# Patient Record
Sex: Female | Born: 1970 | Race: White | Hispanic: No | Marital: Single
Health system: Southern US, Community
[De-identification: ages and names within clinical notes are randomized; demographics above are authoritative.]

## PROBLEM LIST (undated history)

## (undated) DIAGNOSIS — C801 Malignant (primary) neoplasm, unspecified: Secondary | ICD-10-CM

---

## 2019-01-24 ENCOUNTER — Other Ambulatory Visit: Payer: Self-pay | Admitting: Obstetrics and Gynecology

## 2019-01-24 DIAGNOSIS — Z1231 Encounter for screening mammogram for malignant neoplasm of breast: Secondary | ICD-10-CM

## 2019-02-14 ENCOUNTER — Inpatient Hospital Stay: Admission: RE | Admit: 2019-02-14 | Payer: Self-pay | Source: Ambulatory Visit

## 2019-03-21 ENCOUNTER — Inpatient Hospital Stay: Admission: RE | Admit: 2019-03-21 | Payer: Self-pay | Source: Ambulatory Visit

## 2019-07-10 ENCOUNTER — Inpatient Hospital Stay: Admission: RE | Admit: 2019-07-10 | Payer: Self-pay | Source: Ambulatory Visit

## 2019-07-23 ENCOUNTER — Ambulatory Visit: Payer: Managed Care, Other (non HMO) | Admitting: Dermatology

## 2019-07-23 ENCOUNTER — Other Ambulatory Visit: Payer: Self-pay

## 2019-07-23 DIAGNOSIS — L821 Other seborrheic keratosis: Secondary | ICD-10-CM

## 2019-07-23 DIAGNOSIS — Z1283 Encounter for screening for malignant neoplasm of skin: Secondary | ICD-10-CM

## 2019-07-23 DIAGNOSIS — L918 Other hypertrophic disorders of the skin: Secondary | ICD-10-CM | POA: Diagnosis not present

## 2019-07-23 DIAGNOSIS — D224 Melanocytic nevi of scalp and neck: Secondary | ICD-10-CM

## 2019-07-23 DIAGNOSIS — L570 Actinic keratosis: Secondary | ICD-10-CM

## 2019-07-23 DIAGNOSIS — D229 Melanocytic nevi, unspecified: Secondary | ICD-10-CM

## 2019-07-23 DIAGNOSIS — D225 Melanocytic nevi of trunk: Secondary | ICD-10-CM | POA: Diagnosis not present

## 2019-07-23 DIAGNOSIS — L578 Other skin changes due to chronic exposure to nonionizing radiation: Secondary | ICD-10-CM

## 2019-07-23 DIAGNOSIS — D489 Neoplasm of uncertain behavior, unspecified: Secondary | ICD-10-CM

## 2019-07-23 DIAGNOSIS — D1801 Hemangioma of skin and subcutaneous tissue: Secondary | ICD-10-CM

## 2019-07-23 DIAGNOSIS — L814 Other melanin hyperpigmentation: Secondary | ICD-10-CM

## 2019-07-23 NOTE — Progress Notes (Addendum)
New Patient Visit  Subjective  Deanna Ryan is a 49 y.o. female who presents for the following: TBSE and skin tags (across chest and right shoulder, wants removed).  No personal skin cancer hx, has extensive family hx of skin cancer, including Grandmother that had a Melonoma  Objective  Well appearing patient in no apparent distress; mood and affect are within normal limits.  A full examination was performed including scalp, head, eyes, ears, nose, lips, neck, chest, axillae, abdomen, back, buttocks, bilateral upper extremities, bilateral lower extremities, hands, feet, fingers, toes, fingernails, and toenails. All findings within normal limits unless otherwise noted below.  Objective  Chest - Medial [facility]), Left chest, Right Breast, Right Hand dorsal, Right post. shoulder: Erythematous thin papules/macules with gritty scale.   Objective  Left Postauricular Area: 0.5 erythematous skin colored papule  Objective  Mid Back left of midline: 0.3 brown papule           Objective  Right Inframammary Fold: Fleshy, skin-colored pedunculated papules.    Assessment & Plan  AK (actinic keratosis) (5) Right Hand dorsal; Chest - Medial [facility]); Left chest; Right Breast; Right post. shoulder   Recommend daily broad spectrum sunscreen SPF 30+ to sun-exposed areas, reapply every 2 hours as needed. Call for new or changing lesions.  Recommend Heliocare daily in sunny weather (can take up to 2 capsules of regular or normal dose of Heliocare ultra for maximum protection during extended hours outdoors)  Liquid nitrogen was applied for 10-12 seconds to the skin lesion and the expected blistering or scabbing reaction explained. Do not pick at the area. Patient reminded to expect hypopigmented scars from the procedure. Return if lesion fails to fully resolve.  Destruction of lesion - Chest - Medial [facility]), Left chest, Right Breast, Right Hand dorsal, Right post.  shoulder  Destruction method: cryotherapy   Informed consent: discussed and consent obtained   Lesion destroyed using liquid nitrogen: Yes   Outcome: patient tolerated procedure well with no complications   Post-procedure details: wound care instructions given    Neoplasm of uncertain behavior (2) Left Postauricular Area - rule out irritated nevus vs other  Epidermal / dermal shaving  Lesion length (cm):  0.5 Lesion width (cm):  0.5 Margin per side (cm):  0.1 Total excision diameter (cm):  0.7 Informed consent: discussed and consent obtained   Timeout: patient name, date of birth, surgical site, and procedure verified   Anesthesia: the lesion was anesthetized in a standard fashion   Anesthetic:  1% lidocaine w/ epinephrine 1-100,000 buffered w/ 8.4% NaHCO3 Instrument used: flexible razor blade   Outcome: patient tolerated procedure well   Post-procedure details: sterile dressing applied and wound care instructions given   Dressing type: petrolatum and pressure dressing    Specimen 2 - Surgical pathology Differential Diagnosis: Irritated nevus vs other Check Margins: No  Mid Back left of midline - rule out atypia  Epidermal / dermal shaving  Lesion length (cm):  0.3 Lesion width (cm):  0.3 Margin per side (cm):  0.2 Total excision diameter (cm):  0.7 Informed consent: discussed and consent obtained   Timeout: patient name, date of birth, surgical site, and procedure verified   Anesthesia: the lesion was anesthetized in a standard fashion   Anesthetic:  1% lidocaine w/ epinephrine 1-100,000 buffered w/ 8.4% NaHCO3 Instrument used: flexible razor blade   Outcome: patient tolerated procedure well   Post-procedure details: sterile dressing applied and wound care instructions given   Dressing type: petrolatum and pressure dressing  Specimen 1 - Surgical pathology Differential Diagnosis: R/O Atypia Check Margins: No  R/o irritated nevus left postauricular R/o atypia mid  back left of midline  Skin tag Right Inframammary Fold  Benign, observe.      Melanocytic Nevi - Tan-brown and/or pink-flesh-colored symmetric macules and papules - Benign appearing on exam today - Observation - Call clinic for new or changing moles - Recommend daily use of broad spectrum spf 30+ sunscreen to sun-exposed areas.   Actinic Damage - diffuse scaly erythematous macules with underlying dyspigmentation - Discussed options of treatment with 5-fluorouracil/calcipotriene or photodynamic therapy to chest including risks and benefits. Patient defers at this time.  - Recommend daily broad spectrum sunscreen SPF 30+ to sun-exposed areas, reapply every 2 hours as needed.  - Call for new or changing lesions.  Lentigines - Scattered tan macules - Discussed due to sun exposure - Benign, observe - Call for any changes  Hemangiomas - Red papules - Discussed benign nature - Observe - Call for any changes  Seborrheic Keratoses - Stuck-on, waxy, tan-brown and white papules and plaques  - Discussed benign etiology and prognosis. - Observe - Call for any changes  Skin cancer screening performed today.   Return for return 1 to 4 months for AK,  TBSE 1 year.   IDonzetta Kohut, CMA, am acting as scribe for Forest Gleason, MD .  Documentation: I have reviewed the above documentation for accuracy and completeness, and I agree with the above.  Forest Gleason, MD

## 2019-07-23 NOTE — Patient Instructions (Addendum)
Recommend daily broad spectrum sunscreen SPF 30+ to sun-exposed areas, reapply every 2 hours as needed. Call for new or changing lesions.  Recommend Heliocare daily in sunny weather (can take up to 2 capsules of regular or normal dose of Heliocare ultra for maximum protection during extended hours outdoors)  Liquid nitrogen was applied for 10-12 seconds to the skin lesion and the expected blistering or scabbing reaction explained. Do not pick at the area. Patient reminded to expect hypopigmented scars from the procedure. Return if lesion fails to fully resolve.    Shave Excision Benign Lesion Wound Care Instructions  . Leave the original bandage on for 24 hours if possible.  If the bandage becomes soaked or soiled before that time, it is OK to remove it and examine the wound.  A small amount of post-operative bleeding is normal.  If excessive bleeding occurs, remove the bandage, place gauze over the site and apply continuous pressure (no peeking) over the area for 20-30 minutes.  If this does not stop the bleeding, try again for 40 minutes.  If this does not work, please call our clinic as soon as possible (even if after-hours).    . Twice a day, cleanse the wound with soap and water.  If a thick crust develops you may use a Q-tip dipped into dilute hydrogen peroxide (mix 1:1 with water) to dissolve it.  Hydrogen peroxide can slow the healing process, so use it only as needed.  After washing, apply Vaseline jelly or Polysporin ointment.  For best healing, the wound should be covered with a layer of ointment at all times.  This may mean re-applying the ointment several times a day.  For open wounds, continue until it has healed.    . If you have any swelling, keep the area elevated.  . Some redness, tenderness and white or yellow material in the wound is normal healing.  If the area becomes very sore and red, or develops a thick yellow-green material (pus), it may be infected; please notify us.     . Wound healing continues for up to one year following surgery.  It is not unusual to experience pain in the scar from time to time during the interval.  If the pain becomes severe or the scar thickens, you should notify the office.  A slight amount of redness in a scar is expected for the first six months.  After six months, the redness subsides and the scar will soften and fade.  The color difference becomes less noticeable with time.  If there are any problems, return for a post-op surgery check at your earliest convenience.  . Please call our office for any questions or concerns.

## 2019-07-25 NOTE — Progress Notes (Signed)
1. 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 On the spectrum from normal mole to melanoma skin cancer, this is in between but it is much closer to a normal mole.  - These typically do not progress to melanoma or cause any trouble.  - People who have a history of atypical moles do have a slightly increased risk of developing melanoma somewhere on the body, so a yearly full body skin exam by a dermatologist is recommended.  - Monthly self skin checks and daily sun protection are also recommended.  - Please call if you notice a dark spot coming back where this biopsy was taken.  - Please also call if you notice any new or changing spots anywhere else on the body before your follow-up visit.  - Please call our office or send [place] a message if you have any questions or concerns about this biopsy result.      2. Skin , left postauricular area MELANOCYTIC NEVUS, INTRADERMAL TYPE, IRRITATED  This is a NORMAL MOLE. No additional treatment is needed. If you notice any new or changing spots or have other skin concerns in future, please call our office at [phone].    MAs please call

## 2019-10-03 ENCOUNTER — Other Ambulatory Visit: Payer: Self-pay | Admitting: Obstetrics and Gynecology

## 2019-10-03 DIAGNOSIS — Z1231 Encounter for screening mammogram for malignant neoplasm of breast: Secondary | ICD-10-CM

## 2019-10-10 ENCOUNTER — Ambulatory Visit
Admission: RE | Admit: 2019-10-10 | Discharge: 2019-10-10 | Disposition: A | Discharge status: Home / Self Care | Payer: Managed Care, Other (non HMO) | Source: Ambulatory Visit | Attending: Obstetrics and Gynecology | Admitting: Obstetrics and Gynecology

## 2019-10-10 ENCOUNTER — Other Ambulatory Visit: Payer: Self-pay

## 2019-10-10 DIAGNOSIS — Z1231 Encounter for screening mammogram for malignant neoplasm of breast: Secondary | ICD-10-CM | POA: Diagnosis present

## 2019-10-10 HISTORY — DX: Malignant (primary) neoplasm, unspecified: C80.1

## 2019-10-16 ENCOUNTER — Other Ambulatory Visit: Payer: Self-pay | Admitting: Obstetrics and Gynecology

## 2019-10-16 DIAGNOSIS — N6310 Unspecified lump in the right breast, unspecified quadrant: Secondary | ICD-10-CM

## 2019-10-16 DIAGNOSIS — R928 Other abnormal and inconclusive findings on diagnostic imaging of breast: Secondary | ICD-10-CM

## 2019-10-19 ENCOUNTER — Ambulatory Visit
Admission: RE | Admit: 2019-10-19 | Discharge: 2019-10-19 | Disposition: A | Discharge status: Home / Self Care | Payer: Managed Care, Other (non HMO) | Source: Ambulatory Visit | Attending: Obstetrics and Gynecology | Admitting: Obstetrics and Gynecology

## 2019-10-19 DIAGNOSIS — N6310 Unspecified lump in the right breast, unspecified quadrant: Secondary | ICD-10-CM

## 2019-10-19 DIAGNOSIS — R928 Other abnormal and inconclusive findings on diagnostic imaging of breast: Secondary | ICD-10-CM

## 2019-11-22 ENCOUNTER — Ambulatory Visit: Payer: Managed Care, Other (non HMO) | Admitting: Dermatology

## 2021-01-08 ENCOUNTER — Ambulatory Visit (INDEPENDENT_AMBULATORY_CARE_PROVIDER_SITE_OTHER): Payer: Self-pay | Admitting: Dermatology

## 2021-01-08 ENCOUNTER — Other Ambulatory Visit: Payer: Self-pay

## 2021-01-08 DIAGNOSIS — D1800 Hemangioma unspecified site: Secondary | ICD-10-CM

## 2021-01-08 DIAGNOSIS — D229 Melanocytic nevi, unspecified: Secondary | ICD-10-CM

## 2021-01-08 DIAGNOSIS — L578 Other skin changes due to chronic exposure to nonionizing radiation: Secondary | ICD-10-CM

## 2021-01-08 DIAGNOSIS — L821 Other seborrheic keratosis: Secondary | ICD-10-CM

## 2021-01-08 DIAGNOSIS — L853 Xerosis cutis: Secondary | ICD-10-CM

## 2021-01-08 DIAGNOSIS — L814 Other melanin hyperpigmentation: Secondary | ICD-10-CM

## 2021-01-08 DIAGNOSIS — Z86018 Personal history of other benign neoplasm: Secondary | ICD-10-CM

## 2021-01-08 DIAGNOSIS — Z1283 Encounter for screening for malignant neoplasm of skin: Secondary | ICD-10-CM

## 2021-01-08 NOTE — Patient Instructions (Addendum)
Recommend taking Heliocare sun protection supplement daily in sunny weather for additional sun protection. For maximum protection on the sunniest days, you can take up to 2 capsules of regular Heliocare OR take 1 capsule of Heliocare Ultra. For prolonged exposure (such as a full day in the sun), you can repeat your dose of the supplement 4 hours after your first dose. Heliocare can be purchased at [facility] or at [email or web address].   Recommend ammonium lactate moisturizers like Gold Bond Rough and Bumpy moisturizer or CeraVe Rough and Bumpy moisturizer for rough skin.   Instructions for [facility] Medications  One or more of your medications was sent to the [facility] mail order compounding pharmacy. You will receive an email from them and can purchase the medicine through that link. It will then be mailed to your home at the address you confirmed. If for any reason you do not receive an email from them, please check your spam folder. If you still do not find the email, please let us know. [facility] phone number is [phone].  If you have any questions or concerns for your doctor, please call our main line at [phone] and press option 4 to reach your doctor's medical assistant. If no one answers, please leave a voicemail as directed and we will return your call as soon as possible. Messages left after 4 pm will be answered the following business day.   You may also send [place] a message via [place]. We typically respond to MyChart messages within 1-2 business days.  For prescription refills, please ask your pharmacy to contact our office. Our fax number is [phone].  If you have an urgent issue when the clinic is closed that cannot wait until the next business day, you can page your doctor at the number below.    Please note that while we do our best to be available for urgent issues outside of office hours, we are not available 24/7.   If you have an urgent issue  and are unable to reach [place], you may choose to seek medical care at your doctor's office, retail clinic, urgent care center, or emergency room.  If you have a medical emergency, please immediately call 911 or go to the emergency department.  Pager Numbers  - Dr. Nehemiah Massed: [phone]  - Dr. Laurence Ferrari: [phone]  - Dr. Nicole Kindred: [phone]  In the event of inclement weather, please call our main line at [phone] for an update on the status of any delays or closures.  Dermatology Medication Tips: Please keep the boxes that topical medications come in in order to help keep track of the instructions about where and how to use these. Pharmacies typically print the medication instructions only on the boxes and not directly on the medication tubes.   If your medication is too expensive, please contact our office at [phone] option 4 or send [place] a message through [place].   We are unable to tell what your co-pay for medications will be in advance as this is different depending on your insurance coverage. However, we may be able to find a substitute medication at lower cost or fill out paperwork to get insurance to cover a needed medication.   If a prior authorization is required to get your medication covered by your insurance company, please allow [place] 1-2 business days to complete this process.  Drug prices often vary depending on where the prescription is filled and some pharmacies may offer cheaper prices.  The website www.goodrx.com contains  coupons for medications through different pharmacies. The prices here do not account for what the cost may be with help from insurance (it may be cheaper with your insurance), but the website can give you the price if you did not use any insurance.  - You can print the associated coupon and take it with your prescription to the pharmacy.  - You may also stop by our office during regular business hours and pick up a GoodRx coupon card.  - If you need  your prescription sent electronically to a different pharmacy, notify our office through [facility] or by phone at [phone] option 4.

## 2021-01-08 NOTE — Progress Notes (Signed)
   Follow-Up Visit   Subjective  Deanna Ryan is a 50 y.o. female who presents for the following: Annual Exam (Hx dysplastic nevus ). The patient presents for Total-Body Skin Exam (TBSE) for skin cancer screening and mole check.   The following portions of the chart were reviewed this encounter and updated as appropriate:   Allergies  Meds  Problems  Med Hx  Surg Hx  Fam Hx      Review of Systems:  No other skin or systemic complaints except as noted in HPI or Assessment and Plan.  Objective  Well appearing patient in no apparent distress; mood and affect are within normal limits.  A full examination was performed including scalp, head, eyes, ears, nose, lips, neck, chest, axillae, abdomen, back, buttocks, bilateral upper extremities, bilateral lower extremities, hands, feet, fingers, toes, fingernails, and toenails. All findings within normal limits unless otherwise noted below.    Assessment & Plan   Lentigines - Scattered tan macules - Due to sun exposure - Benign-appearing, observe - Recommend daily broad spectrum sunscreen SPF 30+ to sun-exposed areas, reapply every 2 hours as needed. - Call for any changes  Seborrheic Keratoses - Stuck-on, waxy, tan-brown papules and/or plaques  - Benign-appearing - Discussed benign etiology and prognosis. - Observe - Call for any changes  Melanocytic Nevi - Tan-brown and/or pink-flesh-colored symmetric macules and papules - Benign appearing on exam today - Observation - Call clinic for new or changing moles - Recommend daily use of broad spectrum spf 30+ sunscreen to sun-exposed areas.   Hemangiomas - Red papules - Discussed benign nature - Observe - Call for any changes  Actinic Damage - chronic, secondary to cumulative UV radiation exposure/sun exposure over time - diffuse scaly erythematous macules with underlying dyspigmentation - Recommend daily broad spectrum sunscreen SPF 30+ to sun-exposed areas, reapply every 2  hours as needed.  - Recommend staying in the shade or wearing long sleeves, sun glasses (UVA+UVB protection) and wide brim hats (4-inch brim around the entire circumference of the hat). - Call for new or changing lesions. - Discussed 5FU/Calcipotriene treatment. Apply to face/forehead BID x 4 days to the face. If still scaly repeat after one month repeat process. Apply to the chest BID x 7 days.   Xerosis - diffuse xerotic patches - recommend gentle, hydrating skin care - gentle skin care handout given - recommend ammonium lactate moisturizers like Gold Bond Rough and Bumpy moisturizer or CeraVe Rough and Bumpy moisturizer.   Skin cancer screening performed today.  Return in about 1 year (around 01/08/2022) for TBSE.  Luther Redo, CMA, am acting as scribe for Forest Gleason, MD .  Documentation: I have reviewed the above documentation for accuracy and completeness, and I agree with the above.  Forest Gleason, MD

## 2021-01-13 ENCOUNTER — Encounter: Payer: Self-pay | Admitting: Dermatology

## 2021-03-12 IMAGING — MG MM DIGITAL DIAGNOSTIC UNILAT*R* W/ TOMO W/ CAD
6 series · 6 of 18 positions shown · non-contrast
Comparison: Previous exam(s).

CLINICAL DATA: Recall from screening mammography with
tomosynthesis, possible mass involving the slight INNER RIGHT breast
at MIDDLE to POSTERIOR depth.

EXAM:
DIGITAL DIAGNOSTIC RIGHT MAMMOGRAM WITH TOMO
ULTRASOUND RIGHT BREAST

[R CC synth-2D]
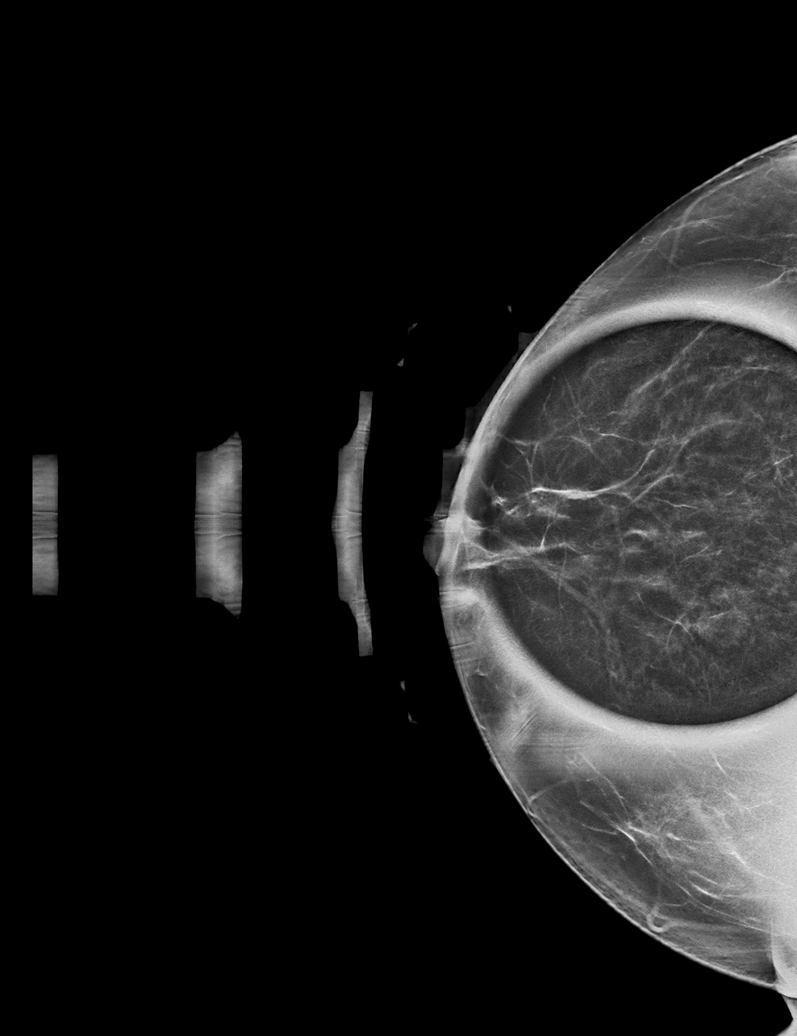

[R MLO synth-2D (1 of 2)]
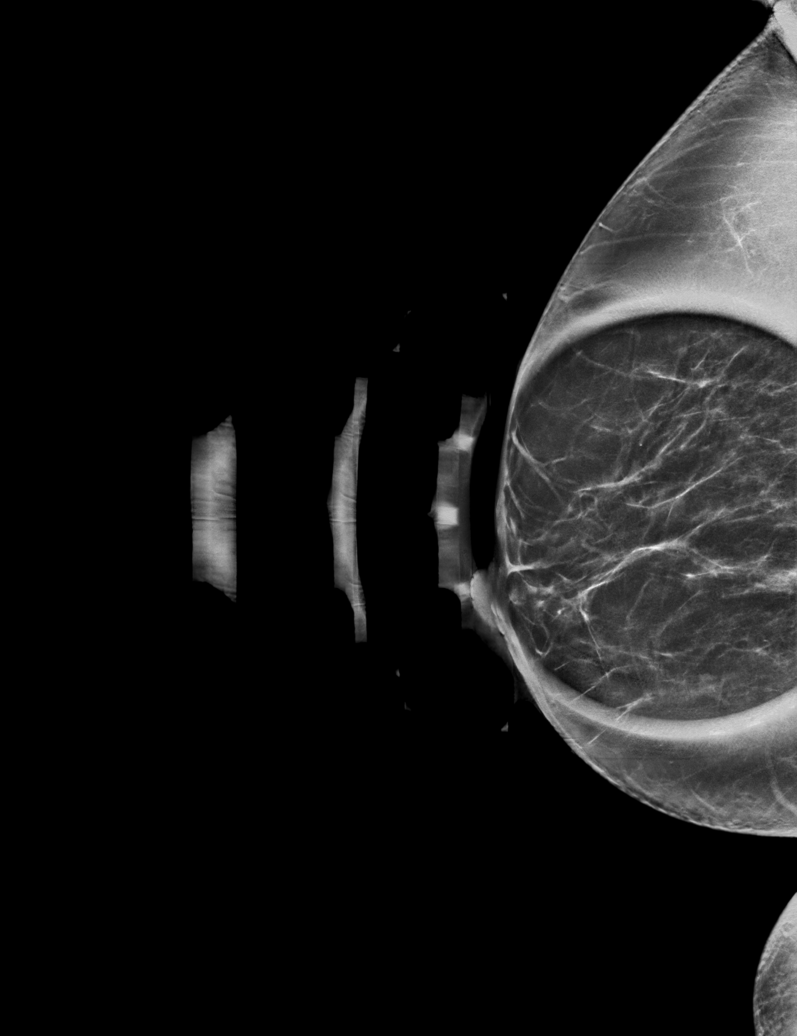

[R MLO synth-2D (2 of 2)]
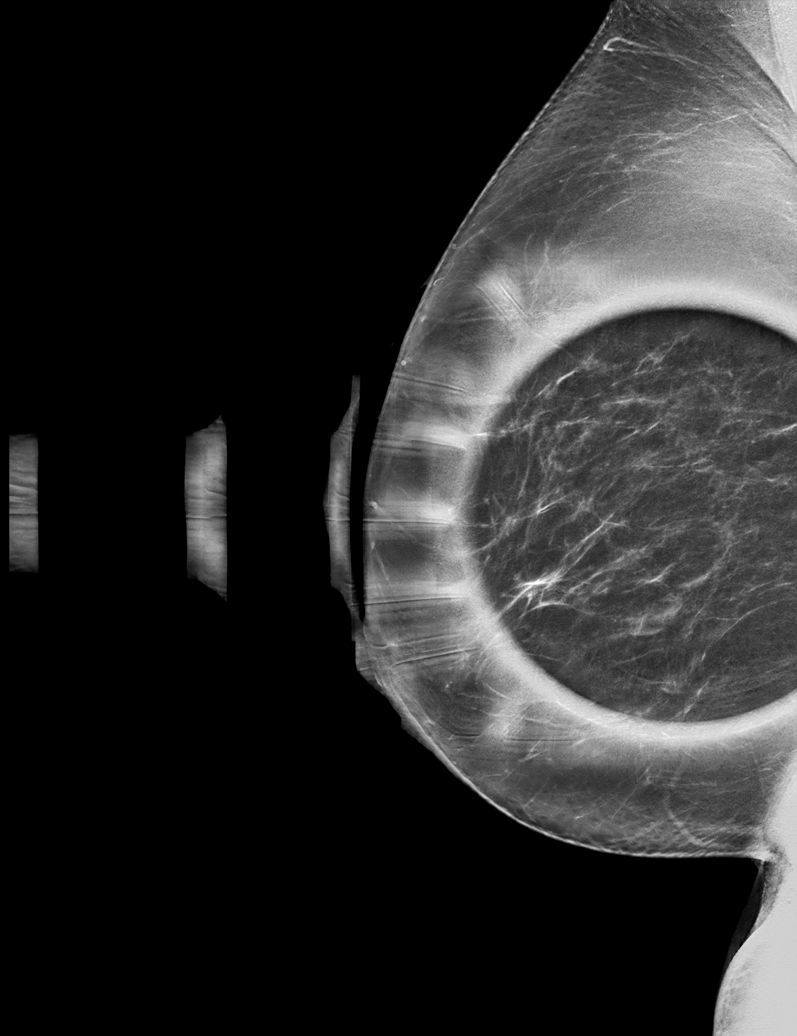

[R CC tomo · tomo slice 29/58.0]
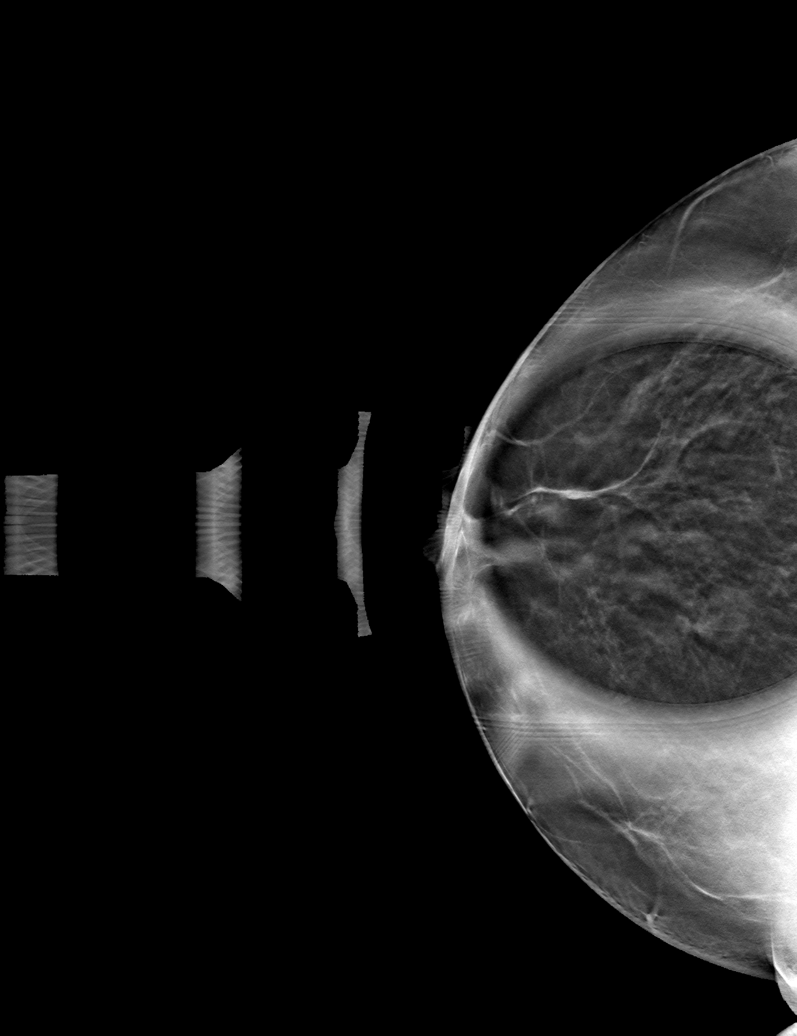

[R MLO tomo (1 of 2) · tomo slice 35/69.0]
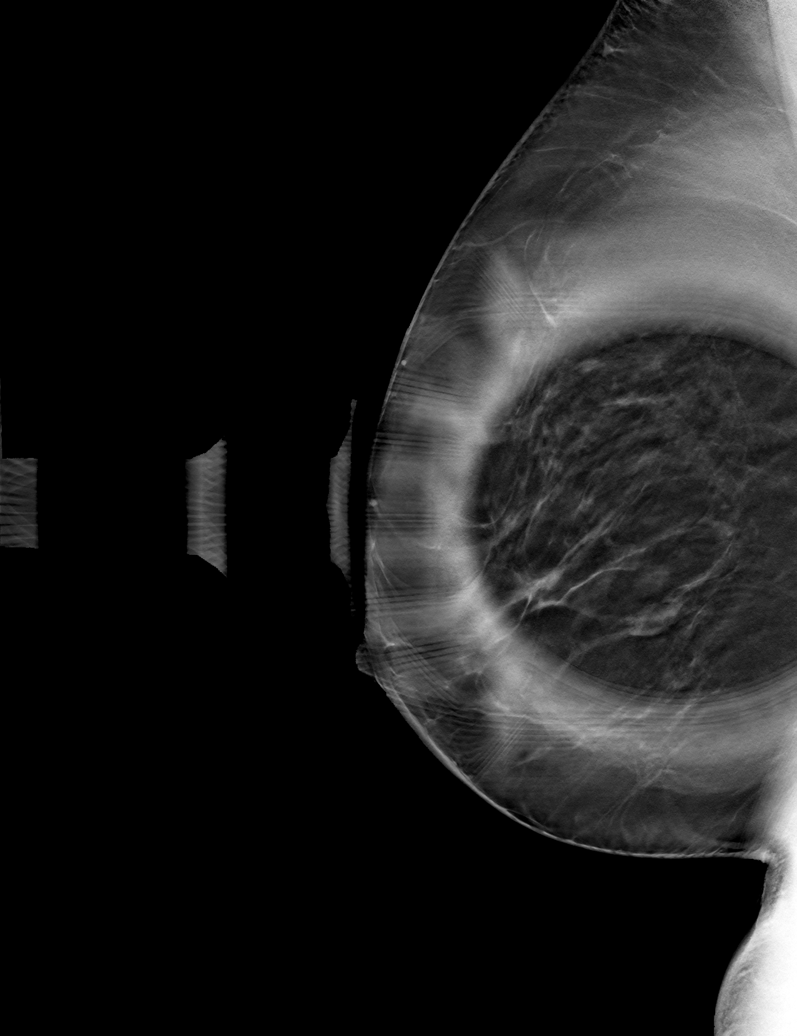

[R MLO tomo (2 of 2) · tomo slice 31/60.0]
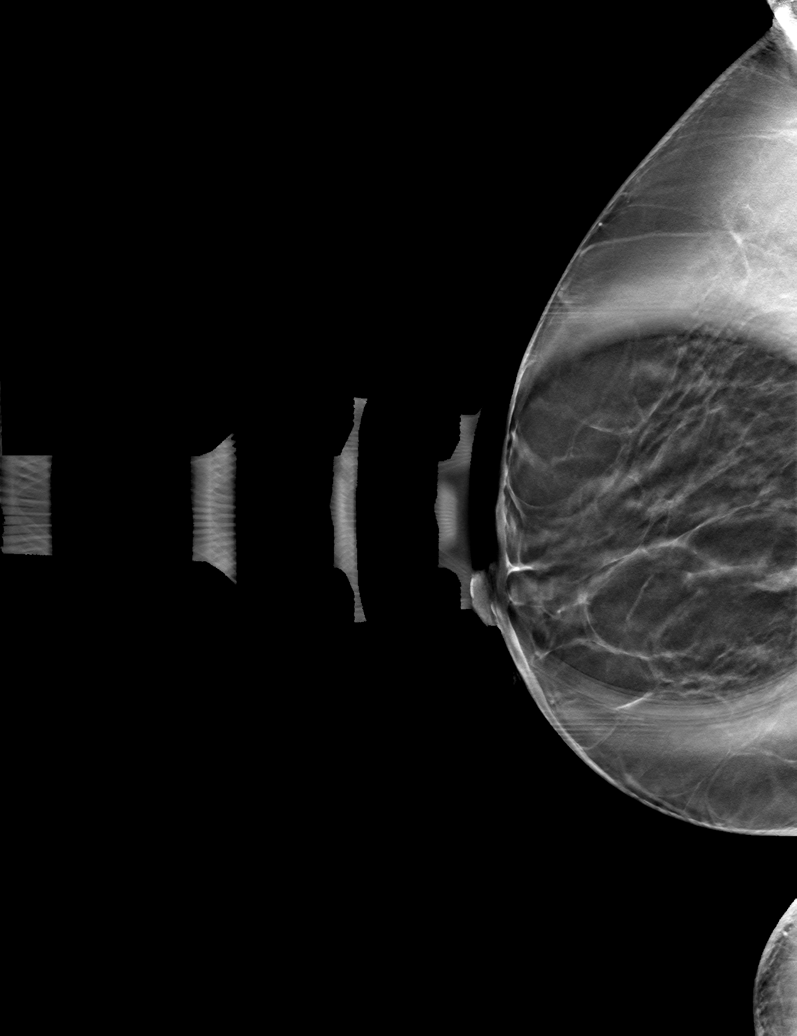

[6 of 18 positions shown; findings below may reference images not displayed]

ACR Breast Density Category b: There are scattered areas of
fibroglandular density.
FINDINGS: Tomosynthesis and synthesized spot-compression CC and MLO views of
the area of concern in the RIGHT breast were obtained.

The mass questioned in the slight INNER breast at MIDDLE to
POSTERIOR depth persists on the spot compression views though
partially disperses, indicating compressibility. There is no
associated architectural distortion or suspicious calcifications.

Targeted RIGHT breast ultrasound is performed, showing benign
clustered cysts at the 3 o'clock position approximately 2 cm from
nipple at MIDDLE depth, measuring approximately 1.6 x 0.6 x 1.0 cm,
demonstrating posterior acoustic enhancement and no internal color
Doppler flow, corresponding to the screening mammographic finding.
No suspicious solid mass or abnormal acoustic shadowing is
identified.
IMPRESSION: 1. No mammographic or sonographic evidence of malignancy involving
the RIGHT breast.
2. Benign clustered cysts in the INNER breast at MIDDLE depth which
accounts for the screening mammographic finding.

RECOMMENDATION:
Screening mammogram in one year.(Code:TB-C-OWN)

I have discussed the findings and recommendations with the patient.
If applicable, a reminder letter will be sent to the patient
regarding the next appointment.

BI-RADS CATEGORY  2: Benign.

## 2021-03-12 IMAGING — US US BREAST*R* LIMITED INC AXILLA
1 series · 7 of 7 positions shown · non-contrast
Comparison: Previous exam(s).

CLINICAL DATA: Recall from screening mammography with
tomosynthesis, possible mass involving the slight INNER RIGHT breast
at MIDDLE to POSTERIOR depth.

EXAM:
DIGITAL DIAGNOSTIC RIGHT MAMMOGRAM WITH TOMO
ULTRASOUND RIGHT BREAST

[Series 1: us breast*right* limited inc axilla · 0.07mm/px · 7 of 7 slices shown]
[im 1/7]
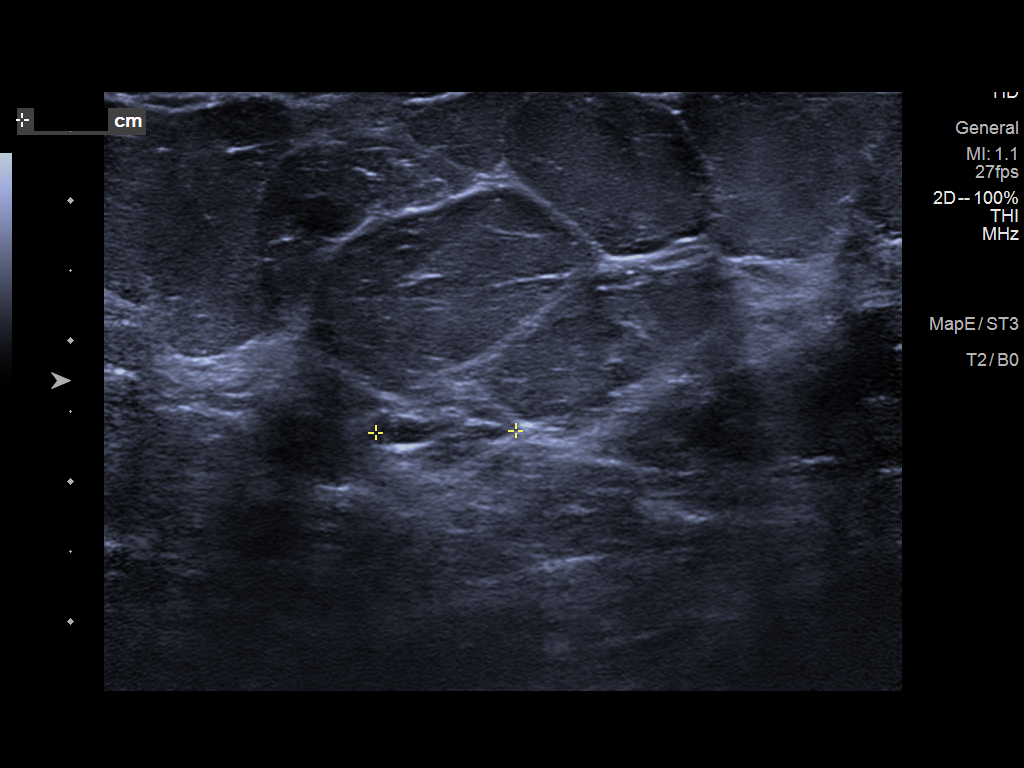
[im 2/7]
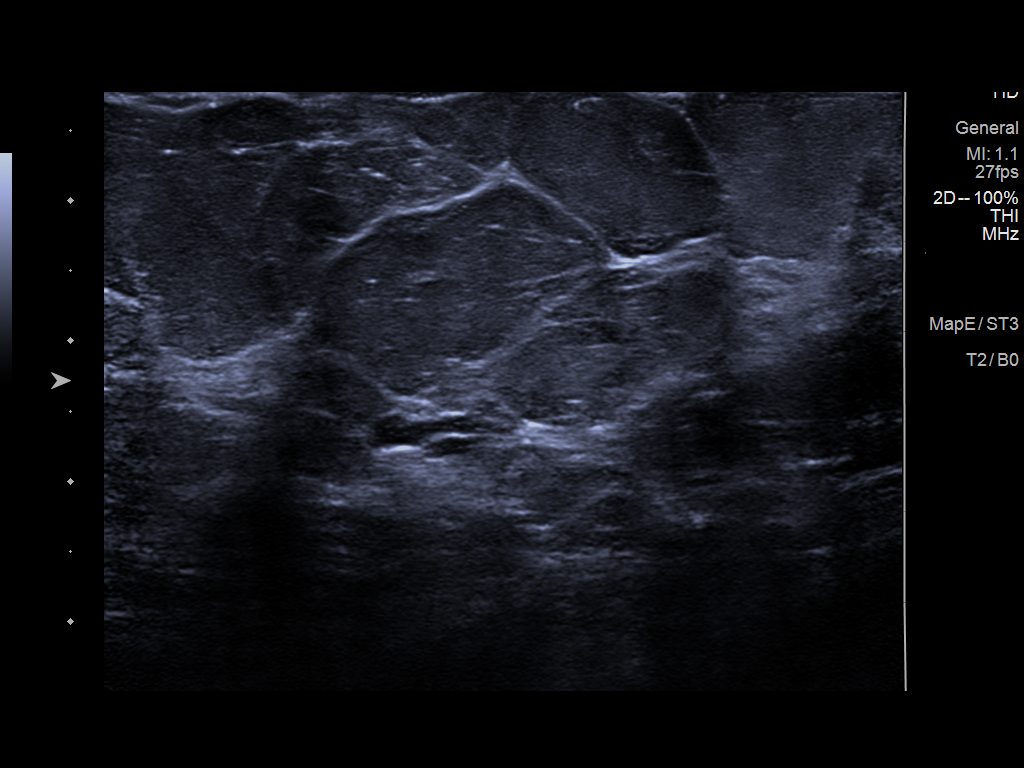
[im 3/7]
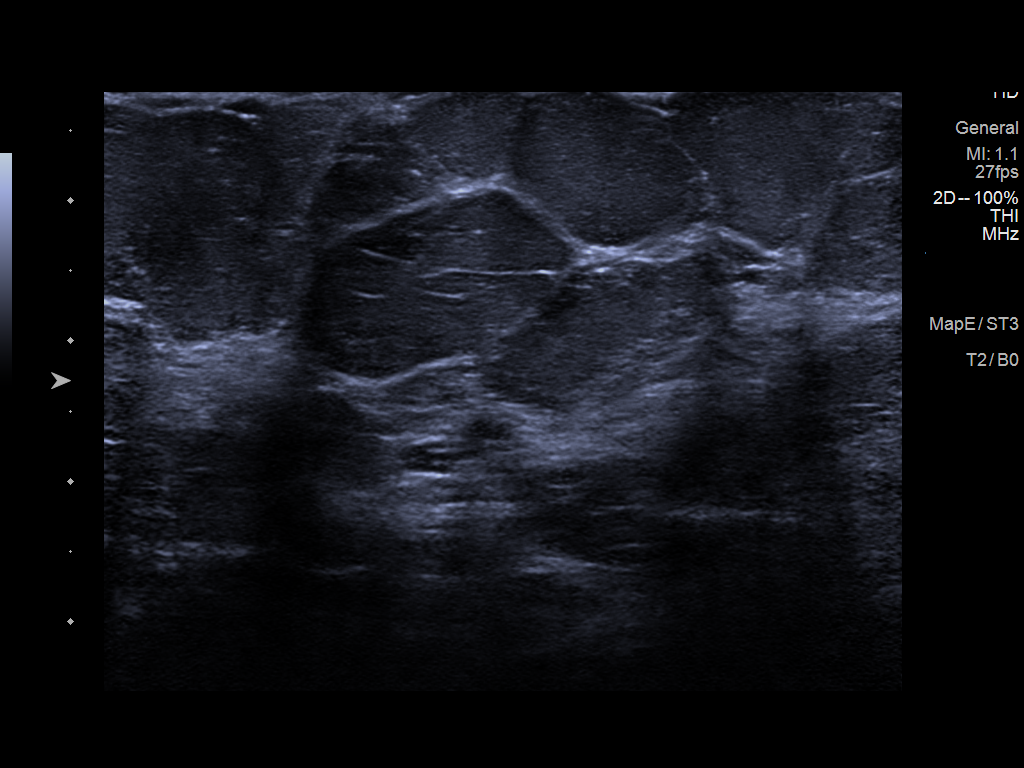
[im 4/7]
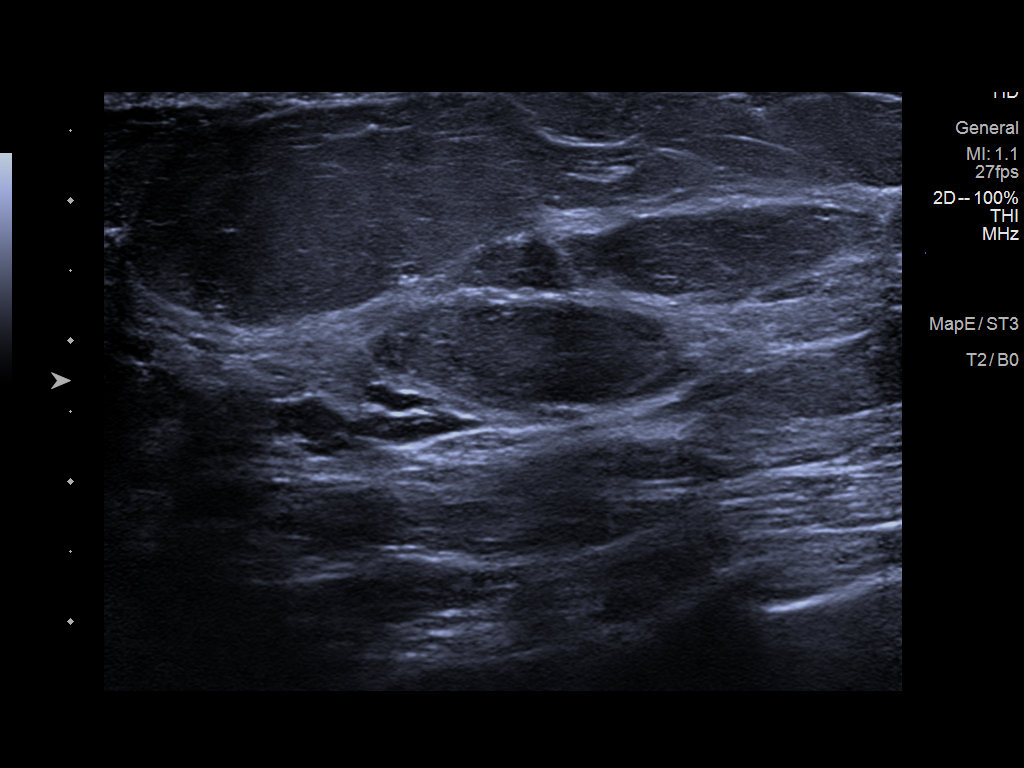
[im 5/7]
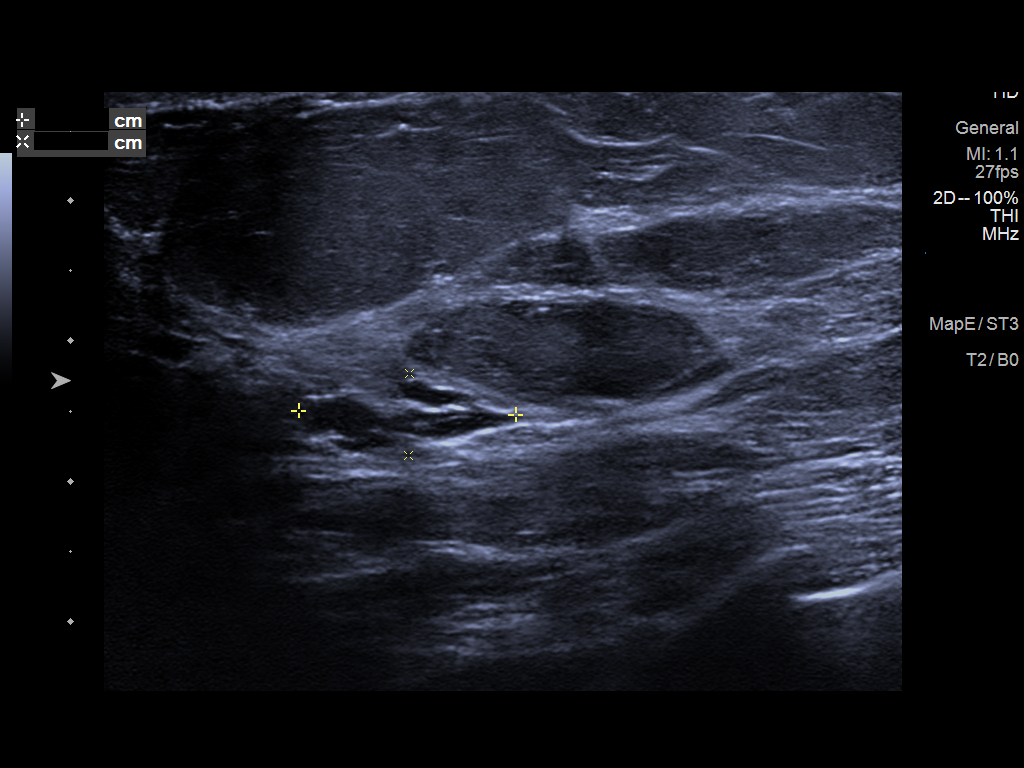
[im 6/7]
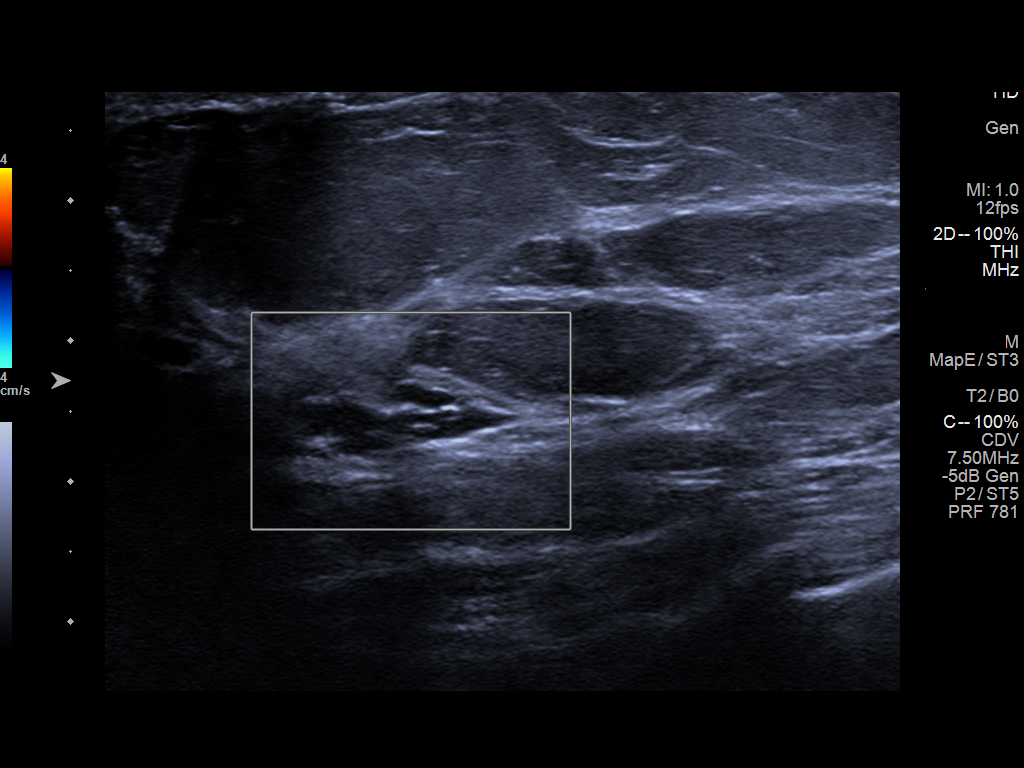
[im 7/7]
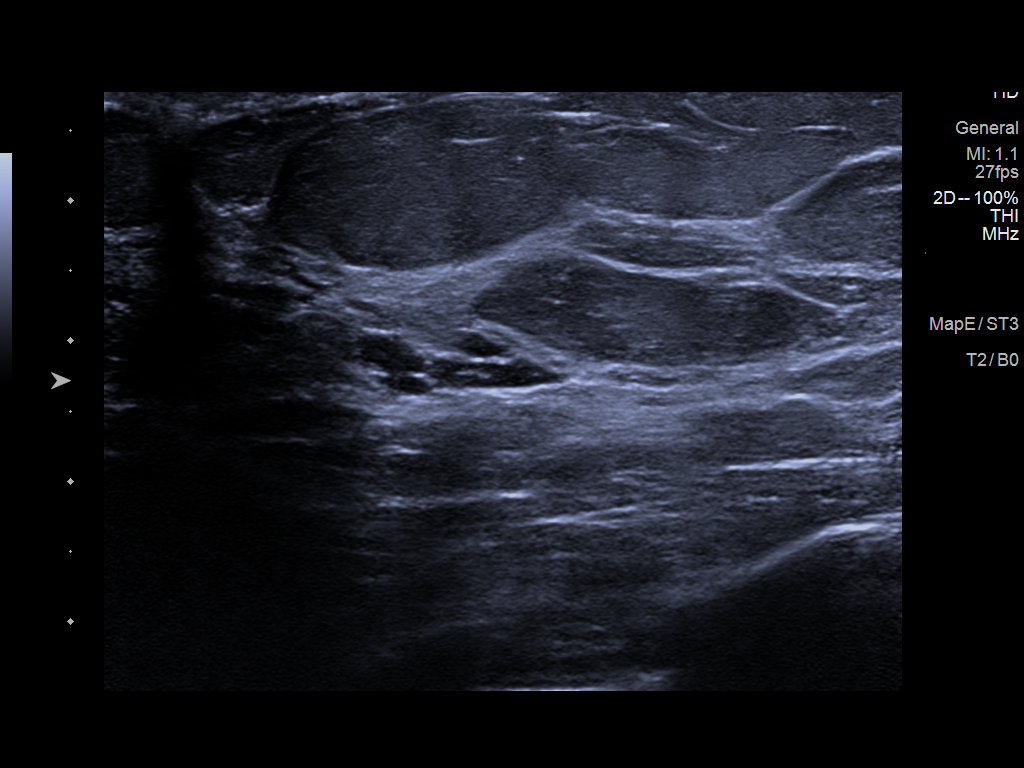

[7 of 7 positions shown; findings below may reference images not displayed]

ACR Breast Density Category b: There are scattered areas of
fibroglandular density.
FINDINGS: Tomosynthesis and synthesized spot-compression CC and MLO views of
the area of concern in the RIGHT breast were obtained.

The mass questioned in the slight INNER breast at MIDDLE to
POSTERIOR depth persists on the spot compression views though
partially disperses, indicating compressibility. There is no
associated architectural distortion or suspicious calcifications.

Targeted RIGHT breast ultrasound is performed, showing benign
clustered cysts at the 3 o'clock position approximately 2 cm from
nipple at MIDDLE depth, measuring approximately 1.6 x 0.6 x 1.0 cm,
demonstrating posterior acoustic enhancement and no internal color
Doppler flow, corresponding to the screening mammographic finding.
No suspicious solid mass or abnormal acoustic shadowing is
identified.
IMPRESSION: 1. No mammographic or sonographic evidence of malignancy involving
the RIGHT breast.
2. Benign clustered cysts in the INNER breast at MIDDLE depth which
accounts for the screening mammographic finding.

RECOMMENDATION:
Screening mammogram in one year.(Code:TB-C-OWN)

I have discussed the findings and recommendations with the patient.
If applicable, a reminder letter will be sent to the patient
regarding the next appointment.

BI-RADS CATEGORY  2: Benign.

## 2021-05-18 ENCOUNTER — Encounter: Payer: Self-pay | Admitting: Dermatology

## 2021-05-18 MED ORDER — HYDROCORTISONE 2.5 % EX CREA: TOPICAL_CREAM | Freq: Two times a day (BID) | CUTANEOUS | 0 refills | Status: AC | PRN

## 2021-05-18 NOTE — Telephone Encounter (Signed)
Spoke with patient and advised her that photo she sent was reviewed by Dr. Nicole Kindred and shows a normal vigorous reaction to topical 5FU/calcipotriene. Patient advised we can send in [facility] 2.5% cream per Dr. Nicole Kindred to use twice daily.

## 2022-01-14 ENCOUNTER — Encounter: Payer: Self-pay | Admitting: Dermatology

## 2023-08-26 ENCOUNTER — Ambulatory Visit: Payer: Self-pay

## 2023-08-26 DIAGNOSIS — Z23 Encounter for immunization: Secondary | ICD-10-CM

## 2023-08-26 DIAGNOSIS — Z719 Counseling, unspecified: Secondary | ICD-10-CM

## 2023-08-26 NOTE — Progress Notes (Signed)
 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 Needed for employment. Counseled on all recommended vaccines.   Covid Pfizer Comirnaty 2024-25 (13yrs+) given without problem. Stayed for apporx 20 min observation without problem. Updated [facility] copy given and reviewed. Kaulin Chaves, RN
